# Patient Record
Sex: Male | Born: 1975 | Marital: Single | Smoking: Heavy tobacco smoker
Health system: Southern US, Community
[De-identification: ages and names within clinical notes are randomized; demographics above are authoritative.]

---

## 2004-04-08 ENCOUNTER — Emergency Department: Payer: Self-pay | Admitting: Internal Medicine

## 2005-02-18 ENCOUNTER — Emergency Department: Payer: Self-pay | Admitting: Emergency Medicine

## 2007-04-28 IMAGING — CR DG FOOT COMPLETE 3+V*L*
1 series · 3 of 3 positions shown · non-contrast
Comparison: none

REASON FOR EXAM: Pain/slipped and twisted foot   MC2
COMMENTS:  LMP: (Male)

PROCEDURE:     DXR - DXR FOOT LT COMP W/OBLIQUES  - February 18, 2005  [DATE]
RESULT:         No acute fractures are identified.   Old fracture healed
deformity at the base of the metatarsal is noted.

[Series 1: view not recorded · 0.17mm/px · 3 of 3 slices shown]
[im 1/3]
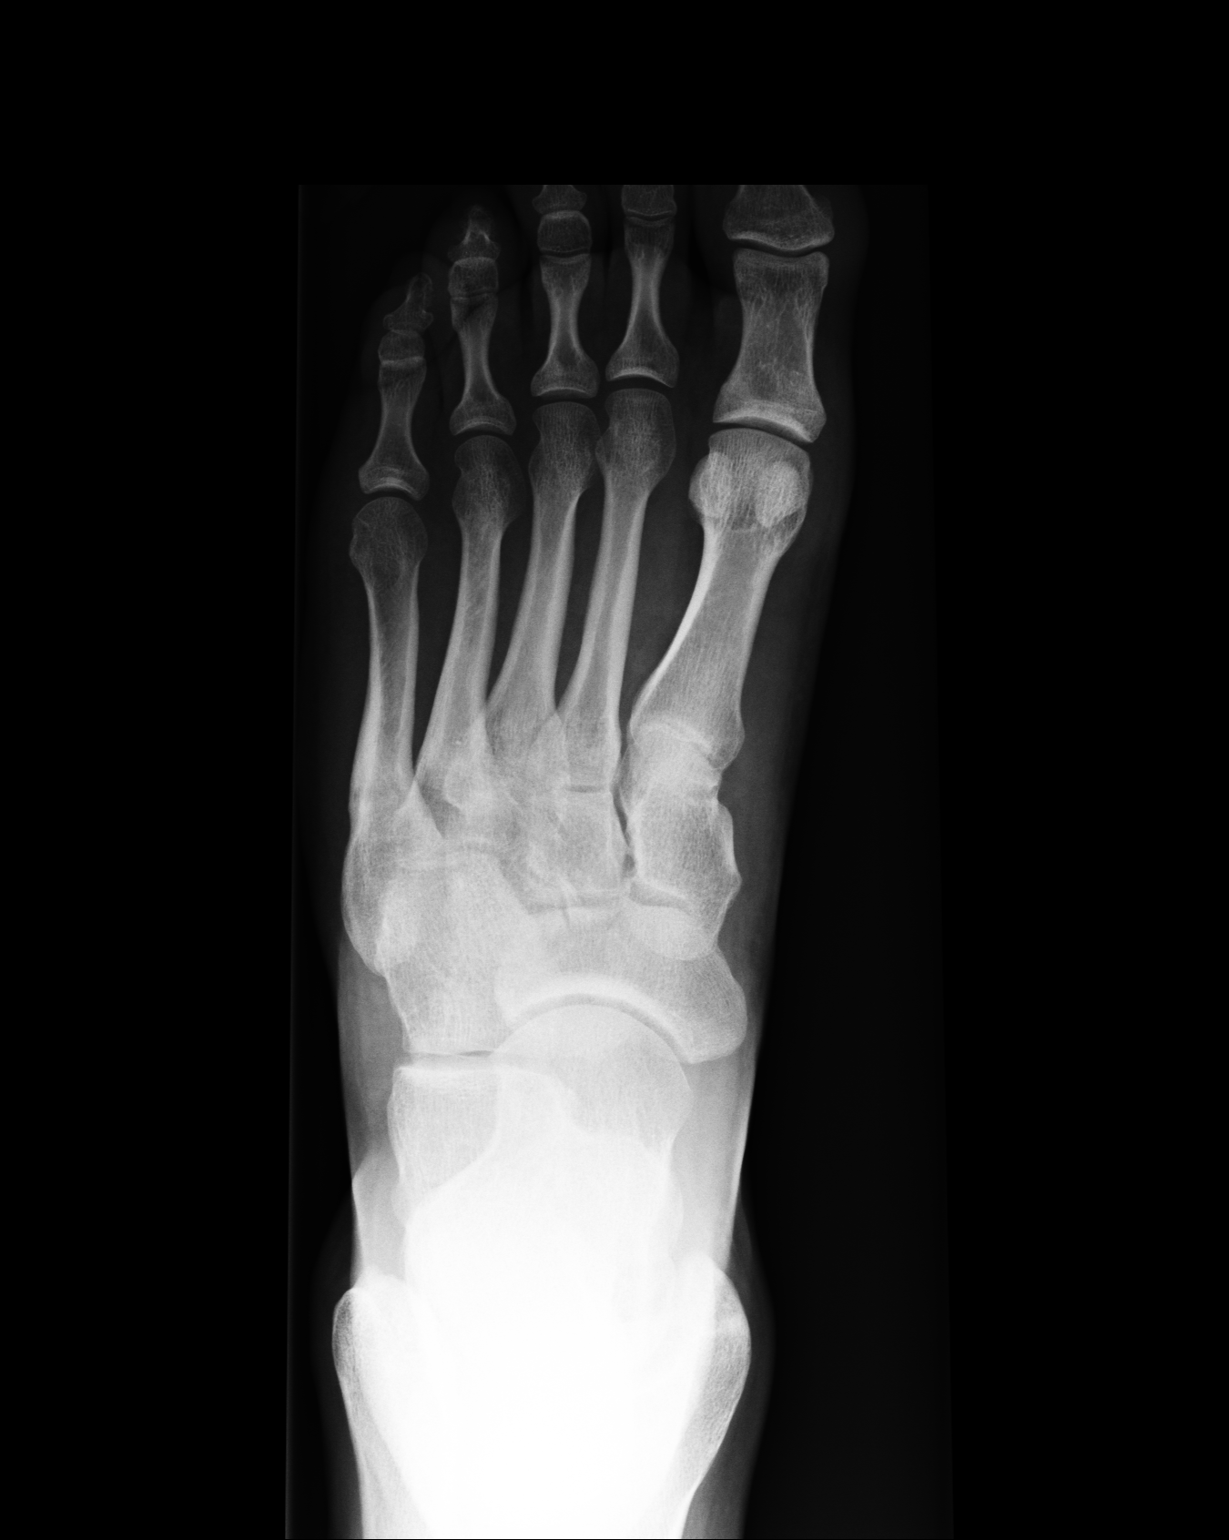
[im 2/3]
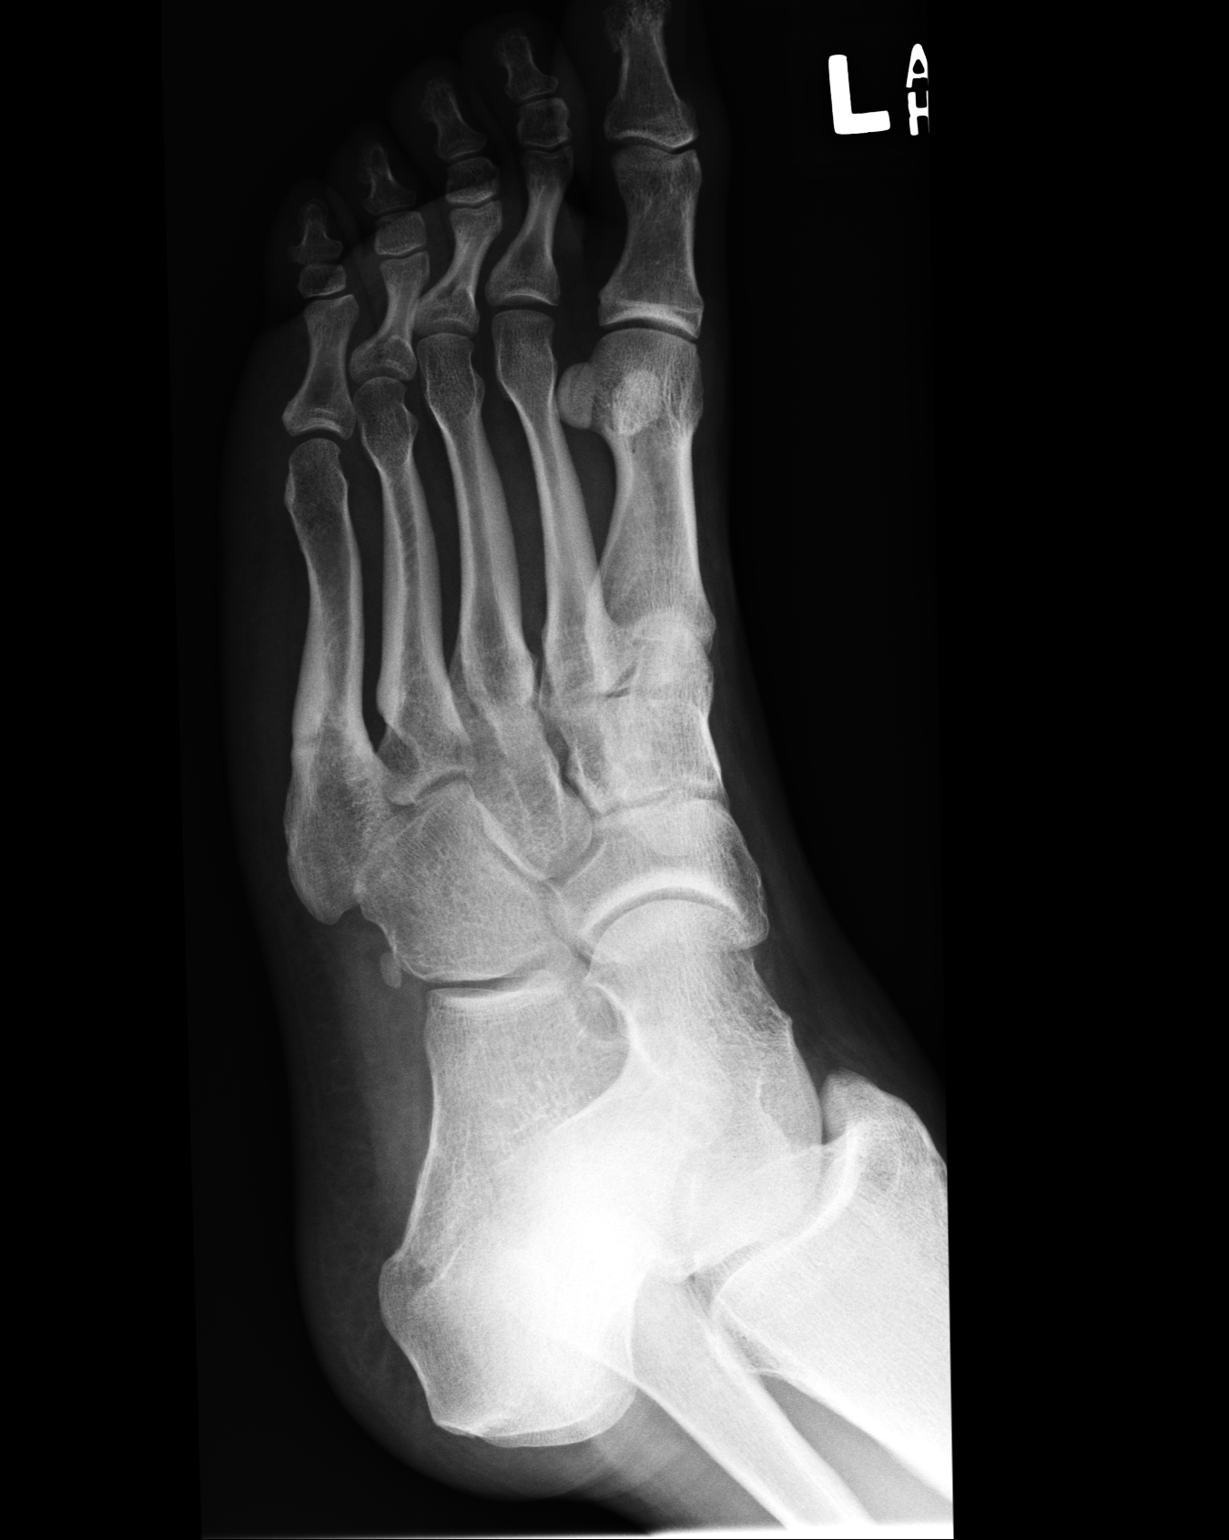
[im 3/3]
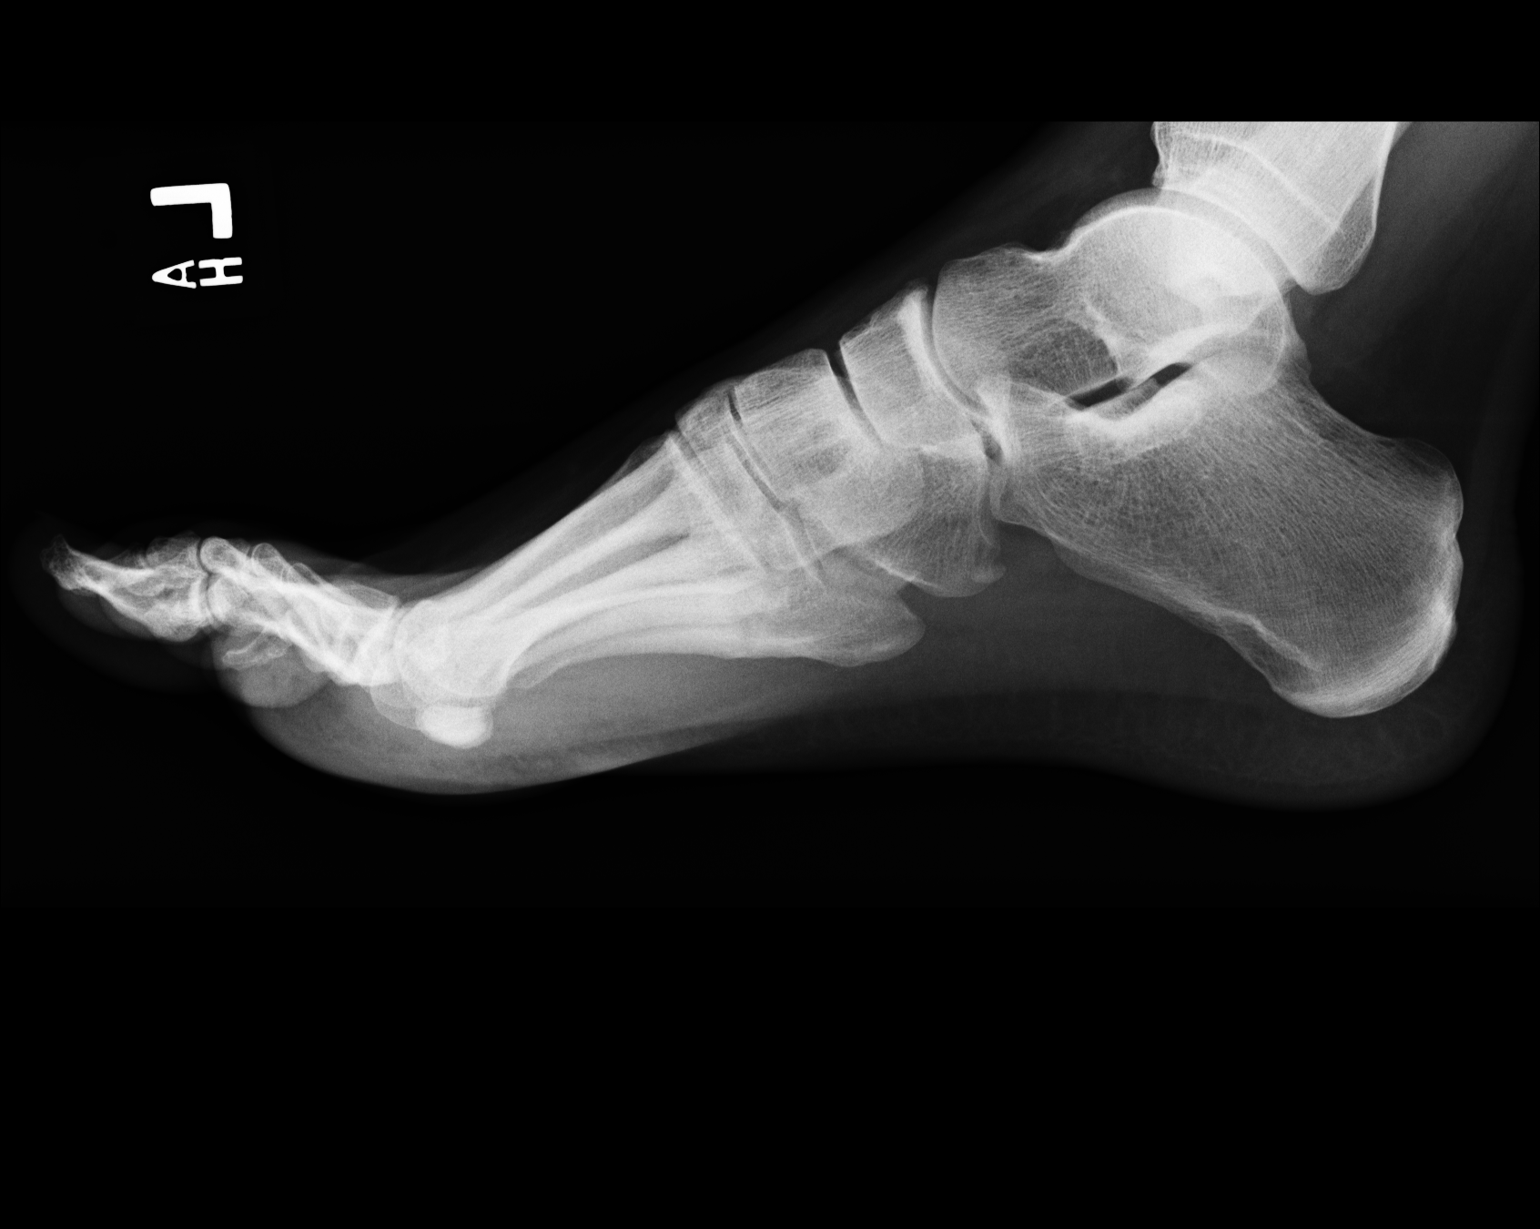

[3 of 3 positions shown; findings below may reference images not displayed]

IMPRESSION: No acute fractures noted of the LEFT foot.

## 2007-09-15 ENCOUNTER — Encounter
Admission: RE | Admit: 2007-09-15 | Discharge: 2007-09-15 | Payer: Self-pay | Admitting: Physical Medicine & Rehabilitation

## 2013-01-07 ENCOUNTER — Emergency Department: Payer: Self-pay | Admitting: Emergency Medicine

## 2013-04-16 ENCOUNTER — Emergency Department: Payer: Self-pay | Admitting: Emergency Medicine

## 2014-09-12 ENCOUNTER — Encounter: Payer: Self-pay | Admitting: Emergency Medicine

## 2014-09-12 ENCOUNTER — Other Ambulatory Visit: Payer: Self-pay

## 2014-09-12 ENCOUNTER — Emergency Department
Admission: EM | Admit: 2014-09-12 | Discharge: 2014-09-13 | Disposition: A | Discharge status: Home / Self Care | Payer: Medicaid Other | Attending: Emergency Medicine | Admitting: Emergency Medicine

## 2014-09-12 DIAGNOSIS — Y9289 Other specified places as the place of occurrence of the external cause: Secondary | ICD-10-CM | POA: Diagnosis not present

## 2014-09-12 DIAGNOSIS — Z720 Tobacco use: Secondary | ICD-10-CM | POA: Insufficient documentation

## 2014-09-12 DIAGNOSIS — X32XXXA Exposure to sunlight, initial encounter: Secondary | ICD-10-CM | POA: Insufficient documentation

## 2014-09-12 DIAGNOSIS — E860 Dehydration: Secondary | ICD-10-CM | POA: Diagnosis not present

## 2014-09-12 DIAGNOSIS — R55 Syncope and collapse: Secondary | ICD-10-CM

## 2014-09-12 DIAGNOSIS — T675XXA Heat exhaustion, unspecified, initial encounter: Secondary | ICD-10-CM | POA: Insufficient documentation

## 2014-09-12 DIAGNOSIS — Y998 Other external cause status: Secondary | ICD-10-CM | POA: Insufficient documentation

## 2014-09-12 DIAGNOSIS — Y9389 Activity, other specified: Secondary | ICD-10-CM | POA: Diagnosis not present

## 2014-09-12 LAB — CBC WITH DIFFERENTIAL/PLATELET
BASOS ABS: 0.2 10*3/uL — AB (ref 0–0.1)
BASOS PCT: 1 %
EOS ABS: 0 10*3/uL (ref 0–0.7)
Eosinophils Relative: 0 %
HEMATOCRIT: 47 % (ref 40.0–52.0)
Hemoglobin: 16.1 g/dL (ref 13.0–18.0)
LYMPHS ABS: 1.7 10*3/uL (ref 1.0–3.6)
Lymphocytes Relative: 11 %
MCH: 31.6 pg (ref 26.0–34.0)
MCHC: 34.4 g/dL (ref 32.0–36.0)
MCV: 91.9 fL (ref 80.0–100.0)
MONO ABS: 1.2 10*3/uL — AB (ref 0.2–1.0)
Monocytes Relative: 7 %
Neutro Abs: 13.2 10*3/uL — ABNORMAL HIGH (ref 1.4–6.5)
Neutrophils Relative %: 81 %
PLATELETS: 231 10*3/uL (ref 150–440)
RBC: 5.11 MIL/uL (ref 4.40–5.90)
RDW: 12.7 % (ref 11.5–14.5)
WBC: 16.4 10*3/uL — ABNORMAL HIGH (ref 3.8–10.6)

## 2014-09-12 LAB — COMPREHENSIVE METABOLIC PANEL
ALT: 42 U/L (ref 17–63)
AST: 30 U/L (ref 15–41)
Albumin: 5.1 g/dL — ABNORMAL HIGH (ref 3.5–5.0)
Alkaline Phosphatase: 53 U/L (ref 38–126)
Anion gap: 13 (ref 5–15)
BILIRUBIN TOTAL: 0.8 mg/dL (ref 0.3–1.2)
BUN: 21 mg/dL — ABNORMAL HIGH (ref 6–20)
CHLORIDE: 100 mmol/L — AB (ref 101–111)
CO2: 26 mmol/L (ref 22–32)
Calcium: 10.6 mg/dL — ABNORMAL HIGH (ref 8.9–10.3)
Creatinine, Ser: 1.93 mg/dL — ABNORMAL HIGH (ref 0.61–1.24)
GFR, EST AFRICAN AMERICAN: 49 mL/min — AB (ref >60)
GFR, EST NON AFRICAN AMERICAN: 42 mL/min — AB (ref >60)
GLUCOSE: 111 mg/dL — AB (ref 65–99)
Potassium: 4.7 mmol/L (ref 3.5–5.1)
SODIUM: 139 mmol/L (ref 135–145)
Total Protein: 8.5 g/dL — ABNORMAL HIGH (ref 6.5–8.1)

## 2014-09-12 LAB — GLUCOSE, CAPILLARY: Glucose-Capillary: 114 mg/dL — ABNORMAL HIGH (ref 65–99)

## 2014-09-12 LAB — TROPONIN I: Troponin I: <0.03 ng/mL (ref <0.031)

## 2014-09-12 MED ORDER — SODIUM CHLORIDE 0.9 % IV BOLUS (SEPSIS)
1000.0000 mL | Freq: Once | INTRAVENOUS | Status: AC
  Administered 2014-09-12: 1000 mL via INTRAVENOUS

## 2014-09-12 MED ORDER — ONDANSETRON HCL 4 MG/2ML IJ SOLN
INTRAMUSCULAR | Status: AC
  Administered 2014-09-12: 4 mg via INTRAVENOUS
  Filled 2014-09-12: qty 2

## 2014-09-12 MED ORDER — ONDANSETRON HCL 4 MG/2ML IJ SOLN
4.0000 mg | Freq: Once | INTRAMUSCULAR | Status: AC
  Administered 2014-09-12: 4 mg via INTRAVENOUS

## 2014-09-12 NOTE — ED Notes (Signed)
Pt arrived to the ED for complaints of near syncope and vomiting. Pt states that he has been out in the sun all day when he started to feel like everything was going white and had to sit down, shortly after the Pt began to vomit. Pt is AOx4 in no apparent distress.

## 2014-09-12 NOTE — ED Provider Notes (Signed)
[facility] Emergency Department Provider Note   ____________________________________________  Time seen: 10:30 PM I have reviewed the triage vital signs and the triage nursing note.  HISTORY  Chief Complaint Near Syncope   Historian Patient and wife  HPI Luke Carpenter is a 39 y.o. male who was working outside all day in the heat when he started to feel lightheaded and dizzy. He states his vision went white and he sat down. He did go inside and have a wet rag around his neck. He states his vision got better, and he was drinking some water but then he promptly threw it up. He has had no nausea, however he has vomited everything he tried to drink since then.  This never happened to him before. He has not had any shortness breath or chest pain. He has not been recently ill. He's not having any specific muscle cramps. He states he was trying to stay hydrated and drink water throughout the day, but it was very hot out.    History reviewed. No pertinent past medical history.  There are no active problems to display for this patient.   History reviewed. No pertinent past surgical history.  No current outpatient prescriptions on file. Suboxone   Allergies Review of patient's allergies indicates no known allergies.  History reviewed. No pertinent family history.  Social History History  Substance Use Topics  . Smoking status: Heavy Tobacco Smoker  . Smokeless tobacco: Not on file  . Alcohol Use: No   previous opioid abuse   Review of Systems  Constitutional: Negative for fever. Eyes:per history of present illness. No eye pain. ENT: After vomiting he feels like his throat is a little bit sore Cardiovascular: Negative for chest pain. Respiratory: Negative for shortness of breath. Gastrointestinal: Negative for abdominal pain, vomiting and diarrhea. Genitourinary: Negative for dysuria. Musculoskeletal: Negative for back pain. Skin: Negative for  rash. Neurological: Negative for headaches, focal weakness or numbness. 10 point Review of Systems otherwise negative ____________________________________________   PHYSICAL EXAM:  VITAL SIGNS: ED Triage Vitals  Enc Vitals Group     BP 09/12/14 2150 115/67 mmHg     Pulse Rate 09/12/14 2150 84     Resp 09/12/14 2150 18     Temp 09/12/14 2150 98.1 F (36.7 C)     Temp Source 09/12/14 2150 Oral     SpO2 09/12/14 2150 99 %     Weight 09/12/14 2150 263 lb (119.296 kg)     Height 09/12/14 2150 6\' 4"  (1.93 m)     Head Cir --      Peak Flow --      Pain Score 09/12/14 2151 0     Pain Loc --      Pain Edu? --      Excl. in GC? --      Constitutional: Alert and oriented. Well appearing and in no distress. Eyes: Conjunctivae are normal. PERRL. Normal extraocular movements. ENT   Head: Normocephalic and atraumatic.   Nose: No congestion/rhinnorhea.   Mouth/Throat: Mucous membranes are moist.   Neck: No stridor. Cardiovascular/Chest: Normal rate, regular rhythm.  No murmurs, rubs, or gallops. Respiratory: Normal respiratory effort without tachypnea nor retractions. Breath sounds are clear and equal bilaterally. No wheezes/rales/rhonchi. Gastrointestinal: Soft. No distention, no guarding, no rebnontender  Genitourinary/rectal:Deferred Musculoskeletal: Nontender with normal range of motion in all extremities. No joint effusions.  No lower extremity tenderness nor edema. Neurologic:  Normal speech and language. No gross focal neurologic deficits are  appreciated. Skin:  Skin is warm, dry and intact. No rash noted. many tattoos Psychiatric: Mood and affect are normal. Speech and behavior are normal. Patient exhibits appropriate insight and judgment.  ____________________________________________   EKG I, Governor Rooks, MD, the attending physician have personally viewed and interpreted all cellular beats per minute normal sinus rhythm. Narrow QRS. Normal  axis.  ____________________________________________  LABS (pertinent positives/negatives)  White blood count 6.4, hemoglobin 16.1 Metabolic panel and troponin pending CK pending ____________________________________________  RADIOLOGY All Xrays were viewed by me. Imaging interpreted by Radiologist. None  _________________________________  PROCEDURES  Procedure(s) performed: None Critical Care performed: None  ____________________________________________   ED COURSE / ASSESSMENT AND PLAN  CONSULTATIONS: None  Pertinent labs & imaging results that were available during my care of the patient were reviewed by me and considered in my medical decision making (see chart for details).  the symptoms sound like the patient had heat exhaustion. His physical exam is normal as well as his vital signs. He is going be given IV fluids and nausea medication while waiting for laboratory evaluation. Although is complaining that his vision went white, I do not think this is an acute vision problem, rather was more likely a presyncopal event. Labs and reevaluation are pending. Patient care transferred to Dr. Manson Passey at shift change her blood p.m.   Patient / Family / Caregiver informed of clinical course, medical decision-making process, and agree with plan.   I discussed return precautions, follow-up instructions, and discharged instructions with patient and/or family.  ___________________________________________   FINAL CLINICAL IMPRESSION(S) / ED DIAGNOSES   Final diagnoses:  Near syncope       Governor Rooks, MD 09/12/14 2305

## 2014-09-13 LAB — URINALYSIS COMPLETE WITH MICROSCOPIC (ARMC ONLY)
BACTERIA UA: NONE SEEN
BILIRUBIN URINE: NEGATIVE
Glucose, UA: NEGATIVE mg/dL
Hgb urine dipstick: NEGATIVE
Ketones, ur: NEGATIVE mg/dL
Leukocytes, UA: NEGATIVE
Nitrite: NEGATIVE
Protein, ur: 30 mg/dL — AB
SPECIFIC GRAVITY, URINE: 1.025 (ref 1.005–1.030)
pH: 5 (ref 5.0–8.0)

## 2014-09-13 LAB — CK: Total CK: 183 U/L (ref 49–397)

## 2014-09-13 MED ORDER — SODIUM CHLORIDE 0.9 % IV BOLUS (SEPSIS)
1000.0000 mL | Freq: Once | INTRAVENOUS | Status: AC
  Administered 2014-09-13: 1000 mL via INTRAVENOUS

## 2014-09-13 NOTE — Discharge Instructions (Signed)
Dehydration, Adult °Dehydration is when you lose more fluids from the body than you take in. Vital organs like the kidneys, brain, and heart cannot function without a proper amount of fluids and salt. Any loss of fluids from the body can cause dehydration.  °CAUSES  °· Vomiting. °· Diarrhea. °· Excessive sweating. °· Excessive urine output. °· Fever. °SYMPTOMS  °Mild dehydration °· Thirst. °· Dry lips. °· Slightly dry mouth. °Moderate dehydration °· Very dry mouth. °· Sunken eyes. °· Skin does not bounce back quickly when lightly pinched and released. °· Dark urine and decreased urine production. °· Decreased tear production. °· Headache. °Severe dehydration °· Very dry mouth. °· Extreme thirst. °· Rapid, weak pulse (more than 100 beats per minute at rest). °· Cold hands and feet. °· Not able to sweat in spite of heat and temperature. °· Rapid breathing. °· Blue lips. °· Confusion and lethargy. °· Difficulty being awakened. °· Minimal urine production. °· No tears. °DIAGNOSIS  °Your caregiver will diagnose dehydration based on your symptoms and your exam. Blood and urine tests will help confirm the diagnosis. The diagnostic evaluation should also identify the cause of dehydration. °TREATMENT  °Treatment of mild or moderate dehydration can often be done at home by increasing the amount of fluids that you drink. It is best to drink small amounts of fluid more often. Drinking too much at one time can make vomiting worse. Refer to the home care instructions below. °Severe dehydration needs to be treated at the hospital where you will probably be given intravenous (IV) fluids that contain water and electrolytes. °HOME CARE INSTRUCTIONS  °· Ask your caregiver about specific rehydration instructions. °· Drink enough fluids to keep your urine clear or pale yellow. °· Drink small amounts frequently if you have nausea and vomiting. °· Eat as you normally do. °· Avoid: °· Foods or drinks high in sugar. °· Carbonated  drinks. °· Juice. °· Extremely hot or cold fluids. °· Drinks with caffeine. °· Fatty, greasy foods. °· Alcohol. °· Tobacco. °· Overeating. °· Gelatin desserts. °· Wash your hands well to avoid spreading bacteria and viruses. °· Only take over-the-counter or prescription medicines for pain, discomfort, or fever as directed by your caregiver. °· Ask your caregiver if you should continue all prescribed and over-the-counter medicines. °· Keep all follow-up appointments with your caregiver. °SEEK MEDICAL CARE IF: °· You have abdominal pain and it increases or stays in one area (localizes). °· You have a rash, stiff neck, or severe headache. °· You are irritable, sleepy, or difficult to awaken. °· You are weak, dizzy, or extremely thirsty. °SEEK IMMEDIATE MEDICAL CARE IF:  °· You are unable to keep fluids down or you get worse despite treatment. °· You have frequent episodes of vomiting or diarrhea. °· You have blood or green matter (bile) in your vomit. °· You have blood in your stool or your stool looks black and tarry. °· You have not urinated in 6 to 8 hours, or you have only urinated a small amount of very dark urine. °· You have a fever. °· You faint. °MAKE SURE YOU:  °· Understand these instructions. °· Will watch your condition. °· Will get help right away if you are not doing well or get worse. °Document Released: 02/24/2005 Document Revised: 05/19/2011 Document Reviewed: 10/14/2010 °[employer]® Patient Information ©2015 [employer]. This information is not intended to replace advice given to you by your health care provider. Make sure you discuss any questions you have with your health care   provider. °Heat-Related Illness °Heat-related illnesses occur when the body is unable to properly cool itself. The body normally cools itself by sweating. However, under some conditions sweating is not enough. In these cases, a person's body temperature rises rapidly. Very high body temperatures may damage the brain or other  vital organs. Some examples of heat-related illnesses include: °· Heat stroke. This occurs when the body is unable to regulate its temperature. The body's temperature rises rapidly, the sweating mechanism fails, and the body is unable to cool down. Body temperature may rise to 106° F (41° C) or higher within 10 to 15 minutes. Heat stroke can cause death or permanent disability if emergency treatment is not provided. °· Heat exhaustion. This is a milder form of heat-related illness that can develop after several days of exposure to high temperatures and not enough fluids. It is the body's response to an excessive loss of the water and salt contained in sweat. °· Heat cramps. These usually affect people who sweat a lot during heavy activity. This sweating drains the body's salt and moisture. The low salt level in the muscles causes painful cramps. Heat cramps may also be a symptom of heat exhaustion. Heat cramps usually occur in the abdomen, arms, or legs. Get medical attention for cramps if you have heart problems or are on a low-sodium diet. °Those that are at greatest risk for heat-related illnesses include:  °· The elderly. °· Infant and the very young. °· People with mental illness and chronic diseases. °· People who are overweight (obese). °· Young and healthy people can even succumb to heat if they participate in strenuous physical activities during hot weather. °CAUSES  °Several factors affect the body's ability to cool itself during extremely hot weather. When the humidity is high, sweat will not evaporate as quickly. This prevents the body from releasing heat quickly. Other factors that can affect the body's ability to cool down include:  °· Age. °· Obesity. °· Fever. °· Dehydration. °· Heart disease. °· Mental illness. °· Poor circulation. °· Sunburn. °· Prescription drug use. °· Alcohol use. °SYMPTOMS  °Heat stroke: Warning signs of heat stroke vary, but may include: °· An extremely high body temperature  (above 103°F orally). °· A fast, strong pulse. °· Dizziness. °· Confusion. °· Red, hot, and dry skin. °· No sweating. °· Throbbing headache. °· Feeling sick to your stomach (nauseous). °· Unconsciousness. °Heat exhaustion: Warning signs of heat exhaustion include: °· Heavy sweating. °· Tiredness. °· Headache. °· Paleness. °· Weakness. °· Feeling sick to your stomach (nauseous) or vomiting. °· Muscle cramps. °Heat cramps °· Muscle pains or spasms. °TREATMENT  °Heat stroke °· Get into a cool environment. An indoor place that is air-conditioned may be best. °· Take a cool shower or bath. Have someone around to make sure you are okay. °· Take your temperature. Make sure it is going down. °Heat exhaustion °· Drink plenty of fluids. Do not drink liquids that contain caffeine, alcohol, or large amounts of sugar. These cause you to lose more body fluid. Also, avoid very cold drinks. They can cause stomach cramps. °· Get into a cool environment. An indoor place that is air-conditioned may be best. °· Take a cool shower or bath. Have someone around to make sure you are okay. °· Put on lightweight clothing. °Heat cramps °· Stop whatever activity you were doing. Do not attempt to do that activity for at least 3 hours after the cramps have gone away. °· Get into a cool environment.   An indoor place that is air-conditioned may be best. °HOME CARE INSTRUCTIONS  °To protect your health when temperatures are extremely high, follow these tips: °· During heavy exercise in a hot environment, drink two to four glasses (16-32 ounces) of cool fluids each hour. Do not wait until you are thirsty to drink. Warning: If your caregiver limits the amount of fluid you drink or has you on water pills, ask how much you should drink while the weather is hot. °· Do not drink liquids that contain caffeine, alcohol, or large amounts of sugar. These cause you to lose more body fluid. °· Avoid very cold drinks. They can cause stomach cramps. °· Wear  appropriate clothing. Choose lightweight, light-colored, loose-fitting clothing. °· If you must be outdoors, try to limit your outdoor activity to morning and evening hours. Try to rest often in shady areas. °· If you are not used to working or exercising in a hot environment, start slowly and pick up the pace gradually. °· Stay cool in an air-conditioned place if possible. If your home does not have air conditioning, go to the shopping mall or public library. °· Taking a cool shower or bath may help you cool off. °SEEK MEDICAL CARE IF:  °· You see any of the symptoms listed above. You may be dealing with a life-threatening emergency. °· Symptoms worsen or last longer than 1 hour. °· Heat cramps do not get better in 1 hour. °MAKE SURE YOU:  °· Understand these instructions. °· Will watch your condition. °· Will get help right away if you are not doing well or get worse. °Document Released: 12/04/2007 Document Revised: 05/19/2011 Document Reviewed: 12/04/2007 °[employer]® Patient Information ©2015 [employer]. This information is not intended to replace advice given to you by your health care provider. Make sure you discuss any questions you have with your health care provider. ° °

## 2014-09-13 NOTE — ED Provider Notes (Signed)
I assumed care from Dr. Shaune PollackLord 11:00 PM. Lab data consistent with Dr. Merlene PullingLord's clinical impression of dehydration with a creatinine of 1.96. Patient received an additional liter of IV normal saline. I evaluated patient patient stated that she feels much better status post IV fluids denies any abdominal pain and no fever.  Darci Currentandolph N Savi Lastinger, MD 09/13/14 [phone]
